# Patient Record
Sex: Male | Born: 2000 | Race: White | Hispanic: No | Marital: Single | Smoking: Never smoker
Health system: Southern US, Community
[De-identification: ages and names within clinical notes are randomized; demographics above are authoritative.]

## PROBLEM LIST (undated history)

## (undated) ENCOUNTER — Emergency Department (HOSPITAL_COMMUNITY): Discharge status: Absent without leave | Payer: BC Managed Care – PPO

## (undated) DIAGNOSIS — F909 Attention-deficit hyperactivity disorder, unspecified type: Secondary | ICD-10-CM

## (undated) DIAGNOSIS — T7840XA Allergy, unspecified, initial encounter: Secondary | ICD-10-CM

## (undated) HISTORY — DX: Allergy, unspecified, initial encounter: T78.40XA

## (undated) HISTORY — DX: Attention-deficit hyperactivity disorder, unspecified type: F90.9

---
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
## 2018-02-09 ENCOUNTER — Encounter: Payer: Self-pay | Admitting: Physician Assistant

## 2018-02-18 ENCOUNTER — Other Ambulatory Visit: Payer: Self-pay

## 2018-02-18 ENCOUNTER — Ambulatory Visit: Payer: BC Managed Care – PPO | Admitting: Physician Assistant

## 2018-02-18 ENCOUNTER — Encounter: Payer: Self-pay | Admitting: Physician Assistant

## 2018-02-18 VITALS — BP 110/60 | HR 70 | Temp 97.8°F | Resp 18 | Ht 67.64 in | Wt 136.2 lb

## 2018-02-18 DIAGNOSIS — F901 Attention-deficit hyperactivity disorder, predominantly hyperactive type: Secondary | ICD-10-CM

## 2018-02-18 DIAGNOSIS — F419 Anxiety disorder, unspecified: Secondary | ICD-10-CM | POA: Diagnosis not present

## 2018-02-18 MED ORDER — AMPHETAMINE-DEXTROAMPHETAMINE 10 MG PO TABS: 10.0000 mg | ORAL_TABLET | Freq: Two times a day (BID) | ORAL | 0 refills | Status: AC

## 2018-02-18 NOTE — Patient Instructions (Addendum)
MORE stretching. Especially the hamstrings and achilles tendons. INCREASE core strengthening (abdominals and glutes). Running techniques: schedule an appointment with the athletic trainer at your school for an evaluation of your current technique and recommended changes to reduce risk of injury and enhance speed, endurance    IF you received an x-ray today, you will receive an invoice from [facility]. Please contact [facility] at [phone] with questions or concerns regarding your invoice.   IF you received labwork today, you will receive an invoice from [facility]. Please contact [facility] at [phone] with questions or concerns regarding your invoice.   Our billing staff will not be able to assist you with questions regarding bills from these companies.  You will be contacted with the lab results as soon as they are available. The fastest way to get your results is to activate your My Chart account. Instructions are located on the last page of this paperwork. If you have not heard from [place] regarding the results in 2 weeks, please contact this office.

## 2018-02-18 NOTE — Assessment & Plan Note (Signed)
Well controlled, but possibly increased irritability with stimulant treatment. Refer for additional evaluation with Howard Boone to identify gaps in his skills for future learning and independence.

## 2018-02-18 NOTE — Progress Notes (Signed)
Patient ID: Howard Boone, male    DOB: [birth year], 17 y.o.   MRN: [id number]  PCP: Harrison Mons, PA-C  Chief Complaint  Patient presents with  . 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  He is accompanied by his mother initially, who then leaves to allow him to talk with me privately.  Heading to [place] with a school program. 9 days of adventure. Maternal aunt and grandmother have paid for the trip. Feels very fortunate.  "I think I hurt my wrist a few days ago." Developed RIGHT wrist pain following a lacrosse game. No recalled injury during play. Improvement with sleeping in a wrist splint x 2 nights. Feels a popping with certain movements. No weakness, tingling, numbness. No swelling.  RIGHT medial knee pain x several years, following an injury. Does not limit him. Shin splints. He'd like to know how to improve his running form to reduce this.  Mom notes irritability with Adderall, his sister has similar adverse effects. It is notable that his parents and sister all take Adderall.  Mom is interested in him developing some coping strategies, organizational strategies, development of ability to take on more of his own self care.  She wonders if he has a mild form of autism, though hasn't wanted him tested for fear of labeling. Ready now, wants him to have the tools to be successful. He's very bright, doesn't apply himself when he isn't interested. For example, struggles in math, reporting that it's boring, while doing well in other classes. Rants. Won't stop talking, interrupting. Sometimes socially inappropriate. Is taking 2 classes at [employer] this semester to complete graduation requirements, bored at school. Accepted to [employer] for the fall, but financially, unlikely to be able to go. Mom is concerned that he doesn't have the skills yet to live away from parents, that he has unrealistic  expectations (like that his mother will move to [place] so that he can live with her while he goes to school).  Review of Systems As above.    Patient Active Problem List   Diagnosis Date Noted  . Abnormal celiac antibody panel 11/26/2017  . 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 Acne 08/12/2016  . Allergic rhinitis 09/13/2012  . ADHD (attention deficit hyperactivity disorder) 12/10/2011  . Anxiety 12/10/2011     Prior to Admission medications   Medication Sig Start Date End Date Taking? Authorizing Provider  amphetamine-dextroamphetamine (ADDERALL) 10 MG tablet Take 1 tablet (10 mg total) by mouth 2 (two) times daily with a meal. 11/26/17  Yes Andilyn Bettcher, PA-C  amphetamine-dextroamphetamine (ADDERALL) 10 MG tablet Take 1 tablet (10 mg total) by mouth 2 (two) times daily with a meal. 11/26/17  Yes Misa Fedorko, PA-C  amphetamine-dextroamphetamine (ADDERALL) 10 MG tablet Take 1 tablet (10 mg total) by mouth 2 (two) times daily with a meal. 11/26/17  Yes Naylani Bradner, PA-C  cetirizine (ZYRTEC) 5 MG tablet Take 10 mg by mouth daily. Reported on 01/04/2016   Yes [provider]  tretinoin (RETIN-A) 0.1 % cream Apply topically at bedtime. 08/12/16  Yes Skilar Marcou, PA-C     No Known Allergies     Objective:  Physical Exam  Constitutional: He is oriented to person, place, and time. He appears well-developed and well-nourished. He is active and cooperative. No distress.  BP 110/60   Pulse 70  Temp 97.8 F (36.6 C) (Oral)   Resp 18   Ht 5' 7.64" (1.718 m)   Wt 136 lb 3.2 oz (61.8 kg)   SpO2 97%   BMI 20.93 kg/m   HENT:  Head: Normocephalic and atraumatic.  Right Ear: Hearing normal.  Left Ear: Hearing normal.  Eyes: Conjunctivae are normal. No scleral icterus.  Neck: Normal range of motion. Neck supple. No thyromegaly present.  Cardiovascular: Normal rate, regular rhythm and normal heart  sounds.  Pulses:      Radial pulses are 2+ on the right side, and 2+ on the left side.  Pulmonary/Chest: Effort normal and breath sounds normal.  Musculoskeletal:       Right wrist: Normal.       Left wrist: Normal.       Cervical back: Normal.       Thoracic back: Normal.       Lumbar back: Normal.       Right forearm: Normal.       Right hand: Normal. Normal sensation noted. Normal strength noted.  Lymphadenopathy:       Head (right side): No tonsillar, no preauricular, no posterior auricular and no occipital adenopathy present.       Head (left side): No tonsillar, no preauricular, no posterior auricular and no occipital adenopathy present.    He has no cervical adenopathy.       Right: No supraclavicular adenopathy present.       Left: No supraclavicular adenopathy present.  Neurological: He is alert and oriented to person, place, and time. No sensory deficit.  Skin: Skin is warm, dry and intact. No rash noted. No cyanosis or erythema. Nails show no clubbing.  Psychiatric: He has a normal mood and affect. His speech is normal and behavior is normal.   Wt Readings from Last 3 Encounters:  02/18/18 136 lb 3.2 oz (61.8 kg) (26 %, Z= -0.63)*  11/26/17 137 lb 9.6 oz (62.4 kg) (30 %, Z= -0.52)*  07/13/17 141 lb (64 kg) (39 %, Z= -0.27)*   * Growth percentiles are based on CDC (Boys, 2-20 Years) data.       Assessment & Plan:   Problem List Items Addressed This Visit    ADHD (attention deficit hyperactivity disorder) - Primary    Well controlled, but possibly increased irritability with stimulant treatment. Refer for additional evaluation with Delmer Islam to identify gaps in his skills for future learning and independence.      Relevant Medications   amphetamine-dextroamphetamine (ADDERALL) 10 MG tablet (Start on 04/19/2018)   amphetamine-dextroamphetamine (ADDERALL) 10 MG tablet (Start on 03/20/2018)   amphetamine-dextroamphetamine (ADDERALL) 10 MG tablet   Other Relevant Orders     Ambulatory referral to Psychology   Anxiety    Appears well controlled.      Relevant Orders   Ambulatory referral to Psychology       Return in about 3 months (around 05/20/2018) for re-evaluation of attention, mood.   Fara Chute, PA-C [facility]

## 2018-02-18 NOTE — Assessment & Plan Note (Signed)
Appears well controlled ?

## 2018-03-04 ENCOUNTER — Ambulatory Visit: Payer: BC Managed Care – PPO | Admitting: Family Medicine

## 2018-03-04 ENCOUNTER — Encounter: Payer: Self-pay | Admitting: Family Medicine

## 2018-03-04 ENCOUNTER — Other Ambulatory Visit: Payer: Self-pay

## 2018-03-04 VITALS — BP 114/64 | HR 66 | Temp 98.0°F | Resp 16 | Ht 67.72 in | Wt 135.0 lb

## 2018-03-04 DIAGNOSIS — R05 Cough: Secondary | ICD-10-CM | POA: Diagnosis not present

## 2018-03-04 DIAGNOSIS — R1031 Right lower quadrant pain: Secondary | ICD-10-CM

## 2018-03-04 DIAGNOSIS — R1032 Left lower quadrant pain: Secondary | ICD-10-CM

## 2018-03-04 DIAGNOSIS — R059 Cough, unspecified: Secondary | ICD-10-CM

## 2018-03-04 LAB — POCT URINALYSIS DIP (MANUAL ENTRY)
BILIRUBIN UA: NEGATIVE
Glucose, UA: NEGATIVE mg/dL
Ketones, POC UA: NEGATIVE mg/dL
Leukocytes, UA: NEGATIVE
NITRITE UA: NEGATIVE
PH UA: 7.5 (ref 5.0–8.0)
Protein Ur, POC: NEGATIVE mg/dL
SPEC GRAV UA: 1.02 (ref 1.010–1.025)
Urobilinogen, UA: 0.2 E.U./dL

## 2018-03-04 NOTE — Progress Notes (Signed)
Chief Complaint  Patient presents with  . Cough    x 3 days ago, lower abdominal pain, will pass gas then it goes away x 2 days  runny nose     HPI  suprapubic tendenressness Pt returned from [place] this morning at 1am He was there for 8 days He reports that he has a warm cramping pain  He denies any diarrhea He is urinating well He has eaten today  When he passes gas it goes away  Cough He states that he has been coughing for 3 days It is nonproductive No fevers or chills No sick contacts No wheezing, no sob, no fatigue   Past Medical History:  Diagnosis Date  . ADHD (attention deficit hyperactivity disorder)   . Allergy     Current Outpatient Medications  Medication Sig Dispense Refill  . [START ON 04/19/2018] amphetamine-dextroamphetamine (ADDERALL) 10 MG tablet Take 1 tablet (10 mg total) by mouth 2 (two) times daily with a meal. 60 tablet 0  . cetirizine (ZYRTEC) 5 MG tablet Take 10 mg by mouth daily. Reported on 01/04/2016    . tretinoin (RETIN-A) 0.1 % cream Apply topically at bedtime. 45 g 2  . [START ON 03/20/2018] amphetamine-dextroamphetamine (ADDERALL) 10 MG tablet Take 1 tablet (10 mg total) by mouth 2 (two) times daily with a meal. (Patient not taking: Reported on 03/04/2018) 60 tablet 0  . amphetamine-dextroamphetamine (ADDERALL) 10 MG tablet Take 1 tablet (10 mg total) by mouth 2 (two) times daily with a meal. (Patient not taking: Reported on 03/04/2018) 60 tablet 0   No current facility-administered medications for this visit.     Allergies: No Known Allergies  History reviewed. No pertinent surgical history.  Social History   Socioeconomic History  . Marital status: Single    Spouse name: Not on file  . Number of children: Not on file  . Years of education: Not on file  . Highest education level: Not on file  Occupational History  . Occupation: Ship broker  Social Needs  . Financial resource strain: Not on file  . Food insecurity:    Worry:  Not on file    Inability: Not on file  . Transportation needs:    Medical: Not on file    Non-medical: Not on file  Tobacco Use  . Smoking status: Never Smoker  . Smokeless tobacco: Never Used  Substance and Sexual Activity  . Alcohol use: No    Alcohol/week: 0.0 oz  . Drug use: No  . Sexual activity: Never  Lifestyle  . Physical activity:    Days per week: Not on file    Minutes per session: Not on file  . Stress: Not on file  Relationships  . Social connections:    Talks on phone: Not on file    Gets together: Not on file    Attends religious service: Not on file    Active member of club or organization: Not on file    Attends meetings of clubs or organizations: Not on file    Relationship status: Not on file  Other Topics Concern  . Not on file  Social History Narrative   Lives with both parents and younger sister in the same household.   Both parents teach at [employer].    Family History  Problem Relation Age of Onset  . Hypertension Mother   . ADD / ADHD Mother   . Anxiety disorder Mother   . Hyperlipidemia Father   . Cancer Father  rectal cancer  . ADD / ADHD Father   . Hypertension Maternal Grandfather   . Diabetes Maternal Grandfather   . Hyperlipidemia Paternal Grandmother   . Heart disease Paternal Grandmother   . Cancer Paternal Grandmother   . Heart disease Paternal Grandfather   . Hyperlipidemia Paternal Grandfather   . ADD / ADHD Sister   . Celiac disease Sister      ROS Review of Systems See HPI Constitution: No fevers or chills No malaise No diaphoresis Skin: No rash or itching Eyes: no blurry vision, no double vision GU: no dysuria or hematuria Neuro: no dizziness or headaches all others reviewed and negative   Objective: Vitals:   03/04/18 1158  BP: 114/64  Pulse: 66  Resp: 16  Temp: 98 F (36.7 C)  SpO2: 98%  Weight: 135 lb (61.2 kg)  Height: 5' 7.72" (1.72 m)    Physical Exam Physical Exam  Constitutional: She  is oriented to person, place, and time. She appears well-developed and well-nourished.  HENT:  Head: Normocephalic and atraumatic.  Eyes: Conjunctivae and EOM are normal.  Ears: TM normal bilaterally, external canal normal bilaterally Cardiovascular: Normal rate, regular rhythm and normal heart sounds.   Pulmonary/Chest: Effort normal and breath sounds normal. No respiratory distress. She has no wheezes.  Abdominal: Normal appearance and bowel sounds are normal. There is no tenderness. There is no CVA tenderness.  Neurological: She is alert and oriented to person, place, and time.     Component     Latest Ref Rng & Units 03/04/2018 03/04/2018        12:27 PM 12:27 PM  Color, UA     yellow yellow   Clarity, UA     clear clear   Glucose     negative mg/dL negative   Bilirubin, UA     negative negative negative  Specific Gravity, UA     1.010 - 1.025 1.020   RBC, UA     negative trace-intact (A)   pH, UA     5.0 - 8.0 7.5   Protein, UA     negative mg/dL negative   Urobilinogen, UA     0.2 or 1.0 E.U./dL 0.2   Nitrite, UA     Negative Negative   Leukocytes, UA     Negative Negative     Assessment and Plan Anmol was seen today for cough.  Diagnoses and all orders for this visit:  Acute bilateral lower abdominal pain- advised continued hydration -     POCT urinalysis dipstick  Cough no URI Advised antihistamines for cough     A

## 2018-03-04 NOTE — Patient Instructions (Signed)
     IF you received an x-ray today, you will receive an invoice from [facility]. Please contact [facility] at [phone] with questions or concerns regarding your invoice.   IF you received labwork today, you will receive an invoice from [facility]. Please contact [facility] at [phone] with questions or concerns regarding your invoice.   Our billing staff will not be able to assist you with questions regarding bills from these companies.  You will be contacted with the lab results as soon as they are available. The fastest way to get your results is to activate your My Chart account. Instructions are located on the last page of this paperwork. If you have not heard from us regarding the results in 2 weeks, please contact this office.     

## 2018-03-27 ENCOUNTER — Ambulatory Visit: Payer: BC Managed Care – PPO | Admitting: Urgent Care

## 2018-03-27 ENCOUNTER — Encounter: Payer: Self-pay | Admitting: Urgent Care

## 2018-03-27 ENCOUNTER — Other Ambulatory Visit: Payer: Self-pay

## 2018-03-27 VITALS — BP 118/64 | HR 64 | Temp 98.4°F | Resp 16 | Ht 67.73 in | Wt 134.6 lb

## 2018-03-27 DIAGNOSIS — B9789 Other viral agents as the cause of diseases classified elsewhere: Secondary | ICD-10-CM

## 2018-03-27 DIAGNOSIS — R0989 Other specified symptoms and signs involving the circulatory and respiratory systems: Secondary | ICD-10-CM

## 2018-03-27 DIAGNOSIS — J069 Acute upper respiratory infection, unspecified: Secondary | ICD-10-CM

## 2018-03-27 MED ORDER — CETIRIZINE HCL 10 MG PO TABS: 10.0000 mg | ORAL_TABLET | Freq: Every day | ORAL | 1 refills | Status: AC

## 2018-03-27 MED ORDER — BENZONATATE 100 MG PO CAPS
100.0000 mg | ORAL_CAPSULE | Freq: Three times a day (TID) | ORAL | 0 refills | Status: AC | PRN
Start: 2018-03-27 — End: ?

## 2018-03-27 MED ORDER — PSEUDOEPHEDRINE HCL ER 120 MG PO TB12: 120.0000 mg | ORAL_TABLET | Freq: Two times a day (BID) | ORAL | 3 refills | Status: AC

## 2018-03-27 NOTE — Progress Notes (Signed)
    MRN: [id number] DOB: 02-Mar-2001  Subjective:   Howard Boone is a 17 y.o. male presenting for 3 day history of runny nose, productive cough. Has tried Mucinex, DayQuil, otc cough medications. Has a history of allergies but does not take anything consistently. Denies history of asthma. Denies smoking cigarettes. Denies sinus pain, ear pain, sore throat, chest pain, shob, abdominal pain, rashes.   Howard Boone has a current medication list which includes the following prescription(s): amphetamine-dextroamphetamine, amphetamine-dextroamphetamine, amphetamine-dextroamphetamine, cetirizine, and tretinoin. Also has No Known Allergies.  Howard Boone  has a past medical history of ADHD (attention deficit hyperactivity disorder) and Allergy. Denies past surgical history.  Objective:   Vitals: BP 118/64   Pulse 64   Temp 98.4 F (36.9 C) (Oral)   Resp 16   Ht 5' 7.73" (1.72 m)   Wt 134 lb 9.6 oz (61.1 kg)   SpO2 96%   BMI 20.63 kg/m   Physical Exam  Constitutional: He is oriented to person, place, and time. He appears well-developed and well-nourished.  HENT:  Throat with thick globs of postnasal drainage and cobblestone pattern.  There is no mucosal edema or sinus tenderness.  TMs opaque bilaterally but without erythema.  Eyes: Right eye exhibits no discharge. Left eye exhibits no discharge. No scleral icterus.  Neck: Normal range of motion. Neck supple.  Cardiovascular: Normal rate, regular rhythm and intact distal pulses. Exam reveals no gallop and no friction rub.  No murmur heard. Pulmonary/Chest: No respiratory distress. He has no wheezes. He has no rales.  Lymphadenopathy:    He has no cervical adenopathy.  Neurological: He is alert and oriented to person, place, and time.  Skin: Skin is warm and dry.  Psychiatric: He has a normal mood and affect.   Assessment and Plan :   Viral URI with cough  Runny nose  We will manage supportively for viral URI.  Recommended patient restart his  allergy medicines.  Follow-up as needed.  Jaynee Eagles, PA-C [facility] [phone] 03/27/2018  4:16 PM

## 2018-03-27 NOTE — Patient Instructions (Addendum)
Viral Respiratory Infection A respiratory infection is an illness that affects part of the respiratory system, such as the lungs, nose, or throat. Most respiratory infections are caused by either viruses or bacteria. A respiratory infection that is caused by a virus is called a viral respiratory infection. Common types of viral respiratory infections include:  A cold.  The flu (influenza).  A respiratory syncytial virus (RSV) infection.  How do I know if I have a viral respiratory infection? Most viral respiratory infections cause:  A stuffy or runny nose.  Yellow or green nasal discharge.  A cough.  Sneezing.  Fatigue.  Achy muscles.  A sore throat.  Sweating or chills.  A fever.  A headache.  How are viral respiratory infections treated? If influenza is diagnosed early, it may be treated with an antiviral medicine that shortens the length of time a person has symptoms. Symptoms of viral respiratory infections may be treated with over-the-counter and prescription medicines, such as:  Expectorants. These make it easier to cough up mucus.  Decongestant nasal sprays.  Health care providers do not prescribe antibiotic medicines for viral infections. This is because antibiotics are designed to kill bacteria. They have no effect on viruses. How do I know if I should stay home from work or school? To avoid exposing others to your respiratory infection, stay home if you have:  A fever.  A persistent cough.  A sore throat.  A runny nose.  Sneezing.  Muscles aches.  Headaches.  Fatigue.  Weakness.  Chills.  Sweating.  Nausea.  Follow these instructions at home:  Rest as much as possible.  Take over-the-counter and prescription medicines only as told by your health care provider.  Drink enough fluid to keep your urine clear or pale yellow. This helps prevent dehydration and helps loosen up mucus.  Gargle with a salt-water mixture 3-4 times per day or  as needed. To make a salt-water mixture, completely dissolve -1 tsp of salt in 1 cup of warm water.  Use nose drops made from salt water to ease congestion and soften raw skin around your nose.  Do not drink alcohol.  Do not use tobacco products, including cigarettes, chewing tobacco, and e-cigarettes. If you need help quitting, ask your health care provider. Contact a health care provider if:  Your symptoms last for 10 days or longer.  Your symptoms get worse over time.  You have a fever.  You have severe sinus pain in your face or forehead.  The glands in your jaw or neck become very swollen. Get help right away if:  You feel pain or pressure in your chest.  You have shortness of breath.  You faint or feel like you will faint.  You have severe and persistent vomiting.  You feel confused or disoriented. This information is not intended to replace advice given to you by your health care provider. Make sure you discuss any questions you have with your health care provider. Document Released: 08/04/2005 Document Revised: 04/01/2016 Document Reviewed: 04/02/2015 Elsevier Interactive Patient Education  2018 [employer].     IF you received an x-ray today, you will receive an invoice from [facility]. Please contact [facility] at [phone] with questions or concerns regarding your invoice.   IF you received labwork today, you will receive an invoice from [place]. Please contact [facility] at [phone] with questions or concerns regarding your invoice.   Our billing staff will not be able to assist you with questions regarding bills from  these companies.  You will be contacted with the lab results as soon as they are available. The fastest way to get your results is to activate your My Chart account. Instructions are located on the last page of this paperwork. If you have not heard from [place] regarding the results in 2 weeks, please contact this office.

## 2018-04-21 ENCOUNTER — Telehealth: Payer: Self-pay | Admitting: Physician Assistant

## 2018-04-21 NOTE — Telephone Encounter (Signed)
Howard Boone from [facility] called to schedule an appt.  Pt states that it was not a good time and that he will call back to schedule an appt.  He has yet to call back [place] to schedule

## 2018-04-27 ENCOUNTER — Encounter: Payer: Self-pay | Admitting: Physician Assistant

## 2018-04-27 ENCOUNTER — Telehealth: Payer: Self-pay | Admitting: Physician Assistant

## 2018-04-27 NOTE — Telephone Encounter (Signed)
Copied from [place] [id number]. Topic: Inquiry >> Apr 27, 2018  9:10 AM Cecelia Byars, NT wrote: Reason for CRM: Patients mom called and said he needs another copy of the letter she previously given him for the [facility] stating that he takes medication for acne please call  [phone] this is a time sensitive issue and he is

## 2018-04-28 NOTE — Telephone Encounter (Signed)
Message sent to [facility].  Re: message for [employer].  Reviewed chart without success for finding a letter previously sent.

## 2018-05-01 NOTE — Telephone Encounter (Signed)
I have mychart messaged this patient to get more information.  The acne medication he has on his medication list was written in 10/17.  I also saw no note in his letter section in regards to this issue.

## 2018-05-01 NOTE — Telephone Encounter (Signed)
Chelle - do you remember anything about this - it has not been prescribed since 08/2016 and at that time only for 2 refills

## 2018-05-02 NOTE — Telephone Encounter (Signed)
Spoke with mother - she got letter from dermatologist.

## 2018-07-01 ENCOUNTER — Emergency Department (HOSPITAL_COMMUNITY): Payer: BC Managed Care – PPO

## 2018-07-01 ENCOUNTER — Other Ambulatory Visit: Payer: Self-pay

## 2018-07-01 ENCOUNTER — Encounter (HOSPITAL_COMMUNITY): Payer: Self-pay | Admitting: Emergency Medicine

## 2018-07-01 ENCOUNTER — Emergency Department (HOSPITAL_COMMUNITY)
Admission: EM | Admit: 2018-07-01 | Discharge: 2018-07-02 | Disposition: A | Discharge status: Home / Self Care | Payer: BC Managed Care – PPO | Attending: Emergency Medicine | Admitting: Emergency Medicine

## 2018-07-01 DIAGNOSIS — Y92015 Private garage of single-family (private) house as the place of occurrence of the external cause: Secondary | ICD-10-CM | POA: Insufficient documentation

## 2018-07-01 DIAGNOSIS — Y999 Unspecified external cause status: Secondary | ICD-10-CM | POA: Diagnosis not present

## 2018-07-01 DIAGNOSIS — W138XXA Fall from, out of or through other building or structure, initial encounter: Secondary | ICD-10-CM | POA: Diagnosis not present

## 2018-07-01 DIAGNOSIS — Z79899 Other long term (current) drug therapy: Secondary | ICD-10-CM | POA: Diagnosis not present

## 2018-07-01 DIAGNOSIS — S51012A Laceration without foreign body of left elbow, initial encounter: Secondary | ICD-10-CM | POA: Diagnosis not present

## 2018-07-01 DIAGNOSIS — T07XXXA Unspecified multiple injuries, initial encounter: Secondary | ICD-10-CM | POA: Diagnosis present

## 2018-07-01 DIAGNOSIS — F909 Attention-deficit hyperactivity disorder, unspecified type: Secondary | ICD-10-CM | POA: Insufficient documentation

## 2018-07-01 DIAGNOSIS — S32010A Wedge compression fracture of first lumbar vertebra, initial encounter for closed fracture: Secondary | ICD-10-CM | POA: Insufficient documentation

## 2018-07-01 DIAGNOSIS — Y939 Activity, unspecified: Secondary | ICD-10-CM | POA: Insufficient documentation

## 2018-07-01 DIAGNOSIS — M545 Low back pain: Secondary | ICD-10-CM | POA: Insufficient documentation

## 2018-07-01 DIAGNOSIS — W19XXXA Unspecified fall, initial encounter: Secondary | ICD-10-CM

## 2018-07-01 MED ORDER — LIDOCAINE-EPINEPHRINE (PF) 2 %-1:200000 IJ SOLN
20.0000 mL | Freq: Once | INTRAMUSCULAR | Status: AC
  Administered 2018-07-02: 20 mL
  Filled 2018-07-01: qty 20

## 2018-07-01 MED ORDER — ACETAMINOPHEN 500 MG PO TABS
1000.0000 mg | ORAL_TABLET | Freq: Once | ORAL | Status: AC
  Administered 2018-07-02: 1000 mg via ORAL
  Filled 2018-07-01: qty 2

## 2018-07-01 MED ORDER — HYDROMORPHONE HCL 1 MG/ML IJ SOLN
1.0000 mg | Freq: Once | INTRAMUSCULAR | Status: AC
  Administered 2018-07-01: 1 mg via INTRAVENOUS
  Filled 2018-07-01: qty 1

## 2018-07-01 MED ORDER — TETANUS-DIPHTH-ACELL PERTUSSIS 5-2.5-18.5 LF-MCG/0.5 IM SUSP: 0.5000 mL | Freq: Once | INTRAMUSCULAR | Status: DC

## 2018-07-01 NOTE — ED Notes (Signed)
Pt not in c-collar on arrival to ED Pt placed in c-collar after removal from EMS stretcher to ED bed

## 2018-07-01 NOTE — ED Notes (Signed)
Bed: RESB Expected date:  Expected time:  Means of arrival:  Comments: EMS Fall

## 2018-07-01 NOTE — ED Notes (Signed)
Irrigated wound with 500 cc NS

## 2018-07-01 NOTE — ED Provider Notes (Signed)
Medical screening examination/treatment/procedure(s) were conducted as a shared visit with non-physician practitioner(s) and myself.  I personally evaluated the patient during the encounter.  None  Results for orders placed or performed in visit on 03/04/18  POCT urinalysis dipstick  Result Value Ref Range   Color, UA yellow yellow   Clarity, UA clear clear   Glucose, UA negative negative mg/dL   Bilirubin, UA negative negative   Ketones, POC UA negative negative mg/dL   Spec Grav, UA 1.020 1.010 - 1.025   Blood, UA trace-intact (A) negative   pH, UA 7.5 5.0 - 8.0   Protein Ur, POC negative negative mg/dL   Urobilinogen, UA 0.2 0.2 or 1.0 E.U./dL   Nitrite, UA Negative Negative   Leukocytes, UA Negative Negative   Dg Lumbar Spine Complete  Result Date: 07/01/2018 CLINICAL DATA:  Post fall. Fall from attic through she walking to garage. Lumbosacral back pain, pelvic and sacral pain. EXAM: LUMBAR SPINE - COMPLETE 4+ VIEW COMPARISON:  Radiograph 07/21/2012 FINDINGS: L1 superior endplate compression fracture with anterior loss of height of approximately 30%. No posterior element involvement by radiograph. Fracture likely extends to the posterior cortex. Remaining vertebral body heights are preserved. No evidence of additional acute fracture. T12 ribs are diminutive. IMPRESSION: L1 superior endplate compression fracture with possible extension to the posterior cortex. Recommend CT for characterization and to evaluate for posterior element involvement. Electronically Signed   By: Jeb Levering M.D.   On: 07/01/2018 22:35   Dg Sacrum/coccyx  Result Date: 07/01/2018 CLINICAL DATA:  Post fall. Fall from attic through she walking to garage. Lumbosacral back pain, pelvic and sacral pain. EXAM: SACRUM AND COCCYX - 2+ VIEW COMPARISON:  None. FINDINGS: L5 vertebra is sacralized. Sacroiliac joints are congruent. Cortical margins of the sacrum and coccyx are intact. No evidence of sacral alar  disruption. IMPRESSION: Negative for fracture of the sacrum or coccyx. Electronically Signed   By: Jeb Levering M.D.   On: 07/01/2018 22:37   Dg Ankle Complete Left  Result Date: 07/01/2018 CLINICAL DATA:  Pain after fall EXAM: LEFT ANKLE COMPLETE - 3+ VIEW COMPARISON:  None. FINDINGS: There is no evidence of fracture, dislocation, or joint effusion. There is no evidence of arthropathy or other focal bone abnormality. Soft tissues are unremarkable. IMPRESSION: Negative. Electronically Signed   By: Dorise Bullion III M.D   On: 07/01/2018 22:32   Ct Head Wo Contrast  Result Date: 07/01/2018 CLINICAL DATA:  [place] from attic EXAM: CT HEAD WITHOUT CONTRAST CT CERVICAL SPINE WITHOUT CONTRAST TECHNIQUE: Multidetector CT imaging of the head and cervical spine was performed following the standard protocol without intravenous contrast. Multiplanar CT image reconstructions of the cervical spine were also generated. COMPARISON:  None. FINDINGS: CT HEAD FINDINGS Brain: No evidence of acute infarction, hemorrhage, hydrocephalus, extra-axial collection or mass lesion/mass effect. Vascular: No hyperdense vessel or unexpected calcification. Skull: Normal. Negative for fracture or focal lesion. Sinuses/Orbits: No acute finding. Other: None CT CERVICAL SPINE FINDINGS Alignment: No subluxation.  Facet alignment within normal limits Skull base and vertebrae: No acute fracture. No primary bone lesion or focal pathologic process. Soft tissues and spinal canal: No prevertebral fluid or swelling. No visible canal hematoma. Disc levels:  Negative Upper chest: Negative Other: Negative IMPRESSION: 1. Negative non contrasted CT appearance of the brain. 2. No acute osseous abnormality of the cervical spine. Electronically Signed   By: Donavan Foil M.D.   On: 07/01/2018 22:14   Ct Cervical Spine Wo Contrast  Result Date:  07/01/2018 CLINICAL DATA:  [place] from attic EXAM: CT HEAD WITHOUT CONTRAST CT CERVICAL SPINE WITHOUT CONTRAST  TECHNIQUE: Multidetector CT imaging of the head and cervical spine was performed following the standard protocol without intravenous contrast. Multiplanar CT image reconstructions of the cervical spine were also generated. COMPARISON:  None. FINDINGS: CT HEAD FINDINGS Brain: No evidence of acute infarction, hemorrhage, hydrocephalus, extra-axial collection or mass lesion/mass effect. Vascular: No hyperdense vessel or unexpected calcification. Skull: Normal. Negative for fracture or focal lesion. Sinuses/Orbits: No acute finding. Other: None CT CERVICAL SPINE FINDINGS Alignment: No subluxation.  Facet alignment within normal limits Skull base and vertebrae: No acute fracture. No primary bone lesion or focal pathologic process. Soft tissues and spinal canal: No prevertebral fluid or swelling. No visible canal hematoma. Disc levels:  Negative Upper chest: Negative Other: Negative IMPRESSION: 1. Negative non contrasted CT appearance of the brain. 2. No acute osseous abnormality of the cervical spine. Electronically Signed   By: Donavan Foil M.D.   On: 07/01/2018 22:14   Dg Hip Unilat W Or Wo Pelvis 2-3 Views Left  Result Date: 07/01/2018 CLINICAL DATA:  Post fall. Fall from attic through she walking to garage. Lumbosacral back pain, pelvic and sacral pain. EXAM: DG HIP (WITH OR WITHOUT PELVIS) 2-3V LEFT COMPARISON:  None. FINDINGS: The cortical margins of the bony pelvis and left hip are intact. No fracture. Pubic symphysis and sacroiliac joints are congruent. Both femoral heads are well-seated in the respective acetabula. IMPRESSION: Negative radiographs of the pelvis and left hip. Electronically Signed   By: Jeb Levering M.D.   On: 07/01/2018 22:37    Patient seen by me along with physician assistant.  Patient was brought in by EMS from her friend's house.  Patient fell from the attic and through the sheet rock and into the garage.  Patient attempted to catch himself on a beam and has a laceration of about  3 inches long to the left upper arm at the medial epicondyle part of the elbow.  No active bleeding.  Patient also with complaint of low back pain.  No loss of consciousness.  Patient's CT head without any acute findings.  X-ray of lumbar back shows an L1 compression fracture CT was recommended by radiology that will be obtained.  Otherwise no other bony injuries.  Also recommend getting a x-ray of the left elbow.  Patient's left arm with good ulnar and radial pulse.  Sensations intact along the ulnar nerve no evidence of any ulnar nerve injury.  No evidence of any encroachment into the joint space after exploration of that wound.  Patient disposition will be depending upon the the L1 compression fracture.  If it does not show any significant encroachment onto the spinal cord or any instability patient can be treated with pain control outpatient follow-up.  The left elbow wound can be sutured closed.   Fredia Sorrow, MD 07/01/18 [phone]

## 2018-07-01 NOTE — ED Provider Notes (Addendum)
[place] DEPT Provider Note   CSN: [id number] Arrival date & time: 07/01/18  2031     History   Chief Complaint Chief Complaint  Patient presents with  . Fall    Through ceiling     HPI Howard Boone is a 17 y.o. male here for evaluation after fall.  Patient states he was in the addict when the flooring cracked and he fell through landing on the ground on the floor below.  Initially made contact with his left heel and then landed on his buttock.  He has a laceration to the left medial elbow.  He endorses mild neck pain, left hip and low back pain, left heel pain and local pain to the laceration of his left elbow.  There was no head trauma per patient.  There was no LOC.  Patient could not get up from the ground secondary to severe back pain.  He denies any associated headache, vision changes, nausea, vomiting, chest pain, shortness of breath, abdominal pain, bleeding, saddle anesthesia, loss of sensation or heaviness to his extremities.  Low back pain and left hip pain exacerbated by movement and palpation.  Received fentanyl on route which has improved the pain.  Has not had any voiding of urine or moved his bowels since but no loss of bladder or bowel contents.  No anticoagulants.  Patient is to begin [facility] December 2019.  HPI  Past Medical History:  Diagnosis Date  . ADHD (attention deficit hyperactivity disorder)   . Allergy     Patient Active Problem List   Diagnosis Date Noted  . Abnormal celiac antibody panel 11/26/2017  . 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 Acne 08/12/2016  . Allergic rhinitis 09/13/2012  . ADHD (attention deficit hyperactivity disorder) 12/10/2011  . Anxiety 12/10/2011    History reviewed. No pertinent surgical history.      Home Medications    Prior to Admission medications   Medication Sig Start Date End Date Taking? Authorizing Provider    amphetamine-dextroamphetamine (ADDERALL) 10 MG tablet Take 1 tablet (10 mg total) by mouth 2 (two) times daily with a meal. Patient not taking: Reported on 07/01/2018 04/19/18   Harrison Mons, PA  amphetamine-dextroamphetamine (ADDERALL) 10 MG tablet Take 1 tablet (10 mg total) by mouth 2 (two) times daily with a meal. Patient not taking: Reported on 07/01/2018 03/20/18   Harrison Mons, PA  amphetamine-dextroamphetamine (ADDERALL) 10 MG tablet Take 1 tablet (10 mg total) by mouth 2 (two) times daily with a meal. Patient not taking: Reported on 07/01/2018 02/18/18   Harrison Mons, PA  benzonatate (TESSALON) 100 MG capsule Take 1-2 capsules (100-200 mg total) by mouth 3 (three) times daily as needed. Patient not taking: Reported on 07/01/2018 03/27/18   Jaynee Eagles, PA-C  cetirizine (ZYRTEC) 10 MG tablet Take 1 tablet (10 mg total) by mouth daily. Patient not taking: Reported on 07/01/2018 03/27/18   Jaynee Eagles, PA-C  oxyCODONE (OXY IR/ROXICODONE) 5 MG immediate release tablet Take 1 tablet (5 mg total) by mouth every 4 (four) hours as needed for up to 3 days for severe pain. 07/02/18 07/05/18  Kinnie Feil, PA-C  pseudoephedrine (SUDAFED 12 HOUR) 120 MG 12 hr tablet Take 1 tablet (120 mg total) by mouth 2 (two) times daily. Patient not taking: Reported on 07/01/2018 03/27/18   Jaynee Eagles, PA-C  tretinoin (RETIN-A) 0.1 % cream Apply topically at bedtime. Patient not taking: Reported on  07/01/2018 08/12/16   Harrison Mons, PA    Family History Family History  Problem Relation Age of Onset  . Hypertension Mother   . ADD / ADHD Mother   . Anxiety disorder Mother   . Hyperlipidemia Father   . Cancer Father        rectal cancer  . ADD / ADHD Father   . Hypertension Maternal Grandfather   . Diabetes Maternal Grandfather   . Hyperlipidemia Paternal Grandmother   . Heart disease Paternal Grandmother   . Cancer Paternal Grandmother   . Heart disease Paternal Grandfather   . Hyperlipidemia  Paternal Grandfather   . ADD / ADHD Sister   . Celiac disease Sister     Social History Social History   Tobacco Use  . Smoking status: Never Smoker  . Smokeless tobacco: Never Used  Substance Use Topics  . Alcohol use: No    Alcohol/week: 0.0 standard drinks  . Drug use: No     Allergies   Patient has no known allergies.   Review of Systems Review of Systems  All other systems reviewed and are negative.    Physical Exam Updated Vital Signs BP 105/63   Pulse (!) 55   Temp 98.1 F (36.7 C) (Oral)   Resp 16   Ht 5\' 8"  (1.727 m)   Wt 62.6 kg   SpO2 96%   BMI 20.98 kg/m   Physical Exam  Constitutional: He is oriented to person, place, and time. He appears well-developed and well-nourished. He is cooperative. He is easily aroused.  In cervical collar.  Family at bedside.  HENT:  Head: Atraumatic.  No abrasions, lacerations, deformity, defect, tenderness or crepitus of facial, nasal, scalp bones. No Raccoon's eyes. No Battle's sign. No hemotympanum or otorrhea, bilaterally. No epistaxis or rhinorrhea, septum midline.  No intraoral bleeding or injury. No malocclusion.   Eyes: Conjunctivae are normal.  Lids normal. EOMs and PERRL intact.   Neck: Spinous process tenderness present.  C-spine: Mild low midline tenderness.  No paraspinal muscular tenderness. ROM of C-spine not tested, patient in cervical collar.  Trachea is midline.    Cardiovascular: Normal rate, regular rhythm, S1 normal, S2 normal and normal heart sounds. Exam reveals no distant heart sounds.  Pulses:      Carotid pulses are 2+ on the right side, and 2+ on the left side.      Radial pulses are 2+ on the right side, and 2+ on the left side.       Dorsalis pedis pulses are 2+ on the right side, and 2+ on the left side.  2+ radial and DP pulses bilaterally  Pulmonary/Chest: Effort normal and breath sounds normal. He has no decreased breath sounds.  No anterior/posterior thorax tenderness. Equal and  symmetric chest wall expansion   Abdominal: Soft.  Abdomen is NTND. No guarding. No seatbelt sign.   Genitourinary:  Genitourinary Comments: No urethral injury or bleeding. Good rectal tone.   Musculoskeletal: Normal range of motion. He exhibits tenderness. He exhibits no deformity.  T-spine: no paraspinal muscular tenderness or midline tenderness.   L-spine: Tenderness to midline upper L-spine.  No overlaying ecchymosis. No tenderness to sacrum.  Pelvis: no instability with AP/L compression, leg shortening or rotation. Decreased left hip ROM secondary to pain. Left hip F/E exacerbates low back pain.  Left elbow: 4 cm straight gaping laceration to left medial epicondyle with adipose tissue exposed, no obvious bone visualized. No focal bony tenderness to epicondyles, olecranon, full PROM of  left elbow without pain.  Left ankle: pain with heel compression. No focal bony tenderness to malleoli, achiles, full F/E of ankle without pain.   Neurological: He is alert, oriented to person, place, and time and easily aroused.  Speech is fluent without obvious dysarthria or dysphasia. Strength 5/5 with hand grip and ankle F/E.   Sensation to light touch intact in hands and feet. CN I, II and VIII not tested. CN II-XII grossly intact bilaterally.   Skin: Skin is warm and dry. Capillary refill takes less than 2 seconds.  Psychiatric: His behavior is normal. Thought content normal.     ED Treatments / Results  Labs (all labs ordered are listed, but only abnormal results are displayed) Labs Reviewed - No data to display  EKG None  Radiology Dg Lumbar Spine Complete  Result Date: 07/01/2018 CLINICAL DATA:  Post fall. Fall from attic through she walking to garage. Lumbosacral back pain, pelvic and sacral pain. EXAM: LUMBAR SPINE - COMPLETE 4+ VIEW COMPARISON:  Radiograph 07/21/2012 FINDINGS: L1 superior endplate compression fracture with anterior loss of height of approximately 30%. No posterior  element involvement by radiograph. Fracture likely extends to the posterior cortex. Remaining vertebral body heights are preserved. No evidence of additional acute fracture. T12 ribs are diminutive. IMPRESSION: L1 superior endplate compression fracture with possible extension to the posterior cortex. Recommend CT for characterization and to evaluate for posterior element involvement. Electronically Signed   By: Jeb Levering M.D.   On: 07/01/2018 22:35   Dg Sacrum/coccyx  Result Date: 07/01/2018 CLINICAL DATA:  Post fall. Fall from attic through she walking to garage. Lumbosacral back pain, pelvic and sacral pain. EXAM: SACRUM AND COCCYX - 2+ VIEW COMPARISON:  None. FINDINGS: L5 vertebra is sacralized. Sacroiliac joints are congruent. Cortical margins of the sacrum and coccyx are intact. No evidence of sacral alar disruption. IMPRESSION: Negative for fracture of the sacrum or coccyx. Electronically Signed   By: Jeb Levering M.D.   On: 07/01/2018 22:37   Dg Elbow Complete Left  Result Date: 07/01/2018 CLINICAL DATA:  17 y/o  M; fall with laceration. EXAM: LEFT ELBOW - COMPLETE 3+ VIEW COMPARISON:  None. FINDINGS: There is no evidence of fracture, dislocation, or joint effusion. There is no evidence of arthropathy or other focal bone abnormality. Soft tissue defect over the medial epicondyle. IMPRESSION: No acute fracture or dislocation identified. Soft tissue defect over the medial epicondyle. Electronically Signed   By: Kristine Garbe M.D.   On: 07/01/2018 23:39   Dg Ankle Complete Left  Result Date: 07/01/2018 CLINICAL DATA:  Pain after fall EXAM: LEFT ANKLE COMPLETE - 3+ VIEW COMPARISON:  None. FINDINGS: There is no evidence of fracture, dislocation, or joint effusion. There is no evidence of arthropathy or other focal bone abnormality. Soft tissues are unremarkable. IMPRESSION: Negative. Electronically Signed   By: Dorise Bullion III M.D   On: 07/01/2018 22:32   Ct Head Wo  Contrast  Result Date: 07/01/2018 CLINICAL DATA:  [place] from attic EXAM: CT HEAD WITHOUT CONTRAST CT CERVICAL SPINE WITHOUT CONTRAST TECHNIQUE: Multidetector CT imaging of the head and cervical spine was performed following the standard protocol without intravenous contrast. Multiplanar CT image reconstructions of the cervical spine were also generated. COMPARISON:  None. FINDINGS: CT HEAD FINDINGS Brain: No evidence of acute infarction, hemorrhage, hydrocephalus, extra-axial collection or mass lesion/mass effect. Vascular: No hyperdense vessel or unexpected calcification. Skull: Normal. Negative for fracture or focal lesion. Sinuses/Orbits: No acute finding. Other: None CT CERVICAL SPINE  FINDINGS Alignment: No subluxation.  Facet alignment within normal limits Skull base and vertebrae: No acute fracture. No primary bone lesion or focal pathologic process. Soft tissues and spinal canal: No prevertebral fluid or swelling. No visible canal hematoma. Disc levels:  Negative Upper chest: Negative Other: Negative IMPRESSION: 1. Negative non contrasted CT appearance of the brain. 2. No acute osseous abnormality of the cervical spine. Electronically Signed   By: Donavan Foil M.D.   On: 07/01/2018 22:14   Ct Cervical Spine Wo Contrast  Result Date: 07/01/2018 CLINICAL DATA:  [place] from attic EXAM: CT HEAD WITHOUT CONTRAST CT CERVICAL SPINE WITHOUT CONTRAST TECHNIQUE: Multidetector CT imaging of the head and cervical spine was performed following the standard protocol without intravenous contrast. Multiplanar CT image reconstructions of the cervical spine were also generated. COMPARISON:  None. FINDINGS: CT HEAD FINDINGS Brain: No evidence of acute infarction, hemorrhage, hydrocephalus, extra-axial collection or mass lesion/mass effect. Vascular: No hyperdense vessel or unexpected calcification. Skull: Normal. Negative for fracture or focal lesion. Sinuses/Orbits: No acute finding. Other: None CT CERVICAL SPINE FINDINGS  Alignment: No subluxation.  Facet alignment within normal limits Skull base and vertebrae: No acute fracture. No primary bone lesion or focal pathologic process. Soft tissues and spinal canal: No prevertebral fluid or swelling. No visible canal hematoma. Disc levels:  Negative Upper chest: Negative Other: Negative IMPRESSION: 1. Negative non contrasted CT appearance of the brain. 2. No acute osseous abnormality of the cervical spine. Electronically Signed   By: Donavan Foil M.D.   On: 07/01/2018 22:14   Ct Lumbar Spine Wo Contrast  Result Date: 07/01/2018 CLINICAL DATA:  17 y/o  M; L1 compression deformity. EXAM: CT LUMBAR SPINE WITHOUT CONTRAST TECHNIQUE: Multidetector CT imaging of the lumbar spine was performed without intravenous contrast administration. Multiplanar CT image reconstructions were also generated. COMPARISON:  07/01/2018 lumbar spine radiograph FINDINGS: Segmentation: L5 vertebral body partial sacralization with articulation of the transverse processes to the sacral ala. Alignment: Normal. Vertebrae: Acute fracture of the L1 superior endplate involving anterior and middle columns with 3 mm bony retropulsion and 30% anterior loss of vertebral body height. No significant associated foraminal or canal stenosis. No additional fracture identified. Paraspinal and other soft tissues: Mild paravertebral edema the L1 fracture level. No appreciable epidural hematoma. Disc levels: No significant loss of intervertebral disc space height. IMPRESSION: Acute fracture of the L1 superior endplate involving anterior and middle columns with 3 mm bony retropulsion and 30% anterior loss of vertebral body height. No significant associated foraminal or canal stenosis. No additional fracture identified. Electronically Signed   By: Kristine Garbe M.D.   On: 07/01/2018 23:48   Dg Hip Unilat W Or Wo Pelvis 2-3 Views Left  Result Date: 07/01/2018 CLINICAL DATA:  Post fall. Fall from attic through she  walking to garage. Lumbosacral back pain, pelvic and sacral pain. EXAM: DG HIP (WITH OR WITHOUT PELVIS) 2-3V LEFT COMPARISON:  None. FINDINGS: The cortical margins of the bony pelvis and left hip are intact. No fracture. Pubic symphysis and sacroiliac joints are congruent. Both femoral heads are well-seated in the respective acetabula. IMPRESSION: Negative radiographs of the pelvis and left hip. Electronically Signed   By: Jeb Levering M.D.   On: 07/01/2018 22:37    Procedures .Critical Care Performed by: Kinnie Feil, PA-C Authorized by: Kinnie Feil, PA-C   Critical care provider statement:    Critical care time (minutes):  45   Critical care was necessary to treat or prevent imminent or  life-threatening deterioration of the following conditions:  Trauma (level 2 trauma)   Critical care was time spent personally by me on the following activities:  Discussions with consultants, evaluation of patient's response to treatment, examination of patient, ordering and performing treatments and interventions, ordering and review of laboratory studies, ordering and review of radiographic studies, pulse oximetry, re-evaluation of patient's condition, obtaining history from patient or surrogate and review of old charts   I assumed direction of critical care for this patient from another provider in my specialty: no    .Marland KitchenLaceration Repair Date/Time: 07/19/2018 9:32 AM Performed by: Kinnie Feil, PA-C Authorized by: Kinnie Feil, PA-C   Consent:    Consent obtained:  Verbal   Consent given by:  Patient and parent   Risks discussed:  Infection, need for additional repair, pain, poor cosmetic result and poor wound healing   Alternatives discussed:  Delayed treatment and referral Universal protocol:    Procedure explained and questions answered to patient or proxy's satisfaction: yes     Imaging studies available: yes     Required blood products, implants, devices, and special  equipment available: yes     Site/side marked: yes     Immediately prior to procedure, a time out was called: yes     Patient identity confirmed:  Verbally with patient Anesthesia (see MAR for exact dosages):    Anesthesia method:  Local infiltration   Local anesthetic:  Lidocaine 2% WITH epi Laceration details:    Location:  Shoulder/arm   Shoulder/arm location:  L elbow   Length (cm):  4 Repair type:    Repair type:  Intermediate Pre-procedure details:    Preparation:  Patient was prepped and draped in usual sterile fashion and imaging obtained to evaluate for foreign bodies Exploration:    Hemostasis achieved with:  Epinephrine and direct pressure   Wound exploration: wound explored through full range of motion and entire depth of wound probed and visualized     Wound exploration comment:  No signs of intraarticular involvement   Wound extent: no foreign bodies/material noted, no tendon damage noted and no underlying fracture noted     Contaminated: no   Treatment:    Area cleansed with:  Betadine   Amount of cleaning:  Extensive   Irrigation solution:  Sterile water   Irrigation volume:  500    Irrigation method:  Pressure wash   Visualized foreign bodies/material removed: no   Skin repair:    Repair method:  Sutures   Suture size:  5-0   Wound skin closure material used: ethilon.   Suture technique:  Horizontal mattress Approximation:    Approximation:  Close Post-procedure details:    Dressing:  Antibiotic ointment and non-adherent dressing   Patient tolerance of procedure:  Tolerated well, no immediate complications   (including critical care time)  Medications Ordered in ED Medications  Tdap (BOOSTRIX) injection 0.5 mL (0.5 mLs Intramuscular Not Given 07/02/18 0022)  lidocaine-EPINEPHrine (XYLOCAINE W/EPI) 2 %-1:200000 (PF) injection 20 mL (20 mLs Infiltration Given by Other 07/02/18 0015)  HYDROmorphone (DILAUDID) injection 1 mg (1 mg Intravenous Given 07/01/18  2357)  acetaminophen (TYLENOL) tablet 1,000 mg (1,000 mg Oral Given 07/02/18 0000)  bacitracin 500 UNIT/GM ointment (1 application  Given 8/56/31 0037)     Initial Impression / Assessment and Plan / ED Course  I have reviewed the triage vital signs and the nursing notes.  Pertinent labs & imaging results that were available during my care of the  patient were reviewed by me and considered in my medical decision making (see chart for details).  Clinical Course as of Jul 02 201  Sat Jul 01, 2018  2245 IMPRESSION: L1 superior endplate compression fracture with possible extension to the posterior cortex. Recommend CT for characterization and to evaluate for posterior element involvement.    DG Lumbar Spine Complete [CG]  Sun Jul 02, 2018  0017 IMPRESSION: Acute fracture of the L1 superior endplate involving anterior and middle columns with 3 mm bony retropulsion and 30% anterior loss of vertebral body height. No significant associated foraminal or canal stenosis. No additional fracture identified.  CT Lumbar Spine Wo Contrast [CG]  0017 IMPRESSION: No acute fracture or dislocation identified. Soft tissue defect over the medial epicondyle.    DG Elbow Complete Left [CG]    Clinical Course User Index [CG] Kinnie Feil, PA-C   42 healthy-year-old male is here after fall through attic floor landing on left heel and low back.  No head trauma, LOC, anticoagulants.  Grossly neurologically intact.  No signs of significant chest, abdominal trauma.  He endorses low back pain.  Good rectal tone.  Imaging as above remarkable for acute fracture of L1 with 30% anterior loss of height and bony retropulsion.  Pain is adequately controlled in the ER.  Lacerations repaired.  Given age, CT findings will consult neurosurgery for recommendations.  0100: Spoke to neurosurgery who recommends TLSO brace and f/u in clinic in 2 weeks.  Will dc with high dose NSAID, oxycodone for break through pain,  bowel regimen. Instructed pt to avoid heavy lifting, twisting or exertional activity.  All pt and parental questions answered. Discussed return precautions.  Shared visit with Dr Rogene Houston. Final Clinical Impressions(s) / ED Diagnoses   Final diagnoses:  Fall, initial encounter  Closed compression fracture of first lumbar vertebra, initial encounter [facility])  Elbow laceration, left, initial encounter    ED Discharge Orders         Ordered    oxyCODONE (OXY IR/ROXICODONE) 5 MG immediate release tablet  Every 4 hours PRN     07/02/18 0115           Kinnie Feil, PA-C 07/02/18 3557    Fredia Sorrow, MD 07/09/18 3220    Kinnie Feil, PA-C 07/19/18 2542    Fredia Sorrow, MD 07/22/18 [phone]

## 2018-07-01 NOTE — ED Triage Notes (Addendum)
Patient BIB EMS from a friend's house. Patient fell from attic, through sheet rock, into the garage. Patient attempted to catch himself on a beam and has a lac about 3 inches long per EMS to left upper arm. Bleeding controlled. Patient fell onto feet and is complaining of lower back pain. Patient given 100 mcg fentanyl en route.

## 2018-07-01 NOTE — ED Notes (Signed)
Patient transported to CT and X ray 

## 2018-07-01 NOTE — ED Notes (Signed)
Patient log rolled. No bruising or bleeding on presentation to back. Tender on lumbar and sacral palpation. No abdominal tenderness.

## 2018-07-02 DIAGNOSIS — S32010A Wedge compression fracture of first lumbar vertebra, initial encounter for closed fracture: Secondary | ICD-10-CM | POA: Diagnosis not present

## 2018-07-02 MED ORDER — BACITRACIN ZINC 500 UNIT/GM EX OINT
TOPICAL_OINTMENT | CUTANEOUS | Status: AC
  Administered 2018-07-02: 1
  Filled 2018-07-02: qty 0.9

## 2018-07-02 MED ORDER — OXYCODONE HCL 5 MG PO TABS: 5.0000 mg | ORAL_TABLET | ORAL | 0 refills | Status: AC | PRN

## 2018-07-02 NOTE — ED Notes (Signed)
[employer] at bedside

## 2018-07-02 NOTE — Discharge Instructions (Addendum)
X-rays show lumbar 1 compression fracture. See printed report.   We spoke to neurosurgery at [facility] and they recommend TLSO brace, pain control and follow up in clinic in 2 weeks.  Do not perform any heavy lifting, twisting or significant back activities.  Short and light amount of walking is okay.  Take 1000 mg acetaminophen plus 600 mg ibuprofen every 8 hours.  Oxycodone for break through or severe pain.  Oxycodone can cause constipation, you should include Miralax and/or stool softener to prevent constipation.   Keep your laceration clean and dry with clean water and soap, tap dry and apply thin layer of antibiotic ointment at least twice daily. Sutures need to be removed in 7-10 days.  Monitor for signs of infection including redness, warmth, swelling, pus, fevers, chills.  Oxycodone is a narcotic pain medication that has risk of overdose, death, dependence and abuse. Mild and expected side effects include nausea, stomach upset, drowsiness, constipation. Do not consume alcohol, drive or use heavy machinery while taking this medication. Do not leave unattended around children. Flush any remaining pills that you do not use and do not share.  The emergency department has a strict policy regarding prescription of narcotic medications. We prescribe a short course for acute, new pain or injuries. We are unable to refill this medication in the emergency department for chronic pain or repeatedly.  Refill need to be done by specialist or primary care provider or pain clinic.  Contact your primary care provider or specialist for chronic pain management and refill on narcotic medications.

## 2018-09-26 ENCOUNTER — Other Ambulatory Visit: Payer: Self-pay | Admitting: Physician Assistant

## 2018-09-26 DIAGNOSIS — N6342 Unspecified lump in left breast, subareolar: Secondary | ICD-10-CM

## 2018-09-28 ENCOUNTER — Ambulatory Visit
Admission: RE | Admit: 2018-09-28 | Discharge: 2018-09-28 | Disposition: A | Discharge status: Home / Self Care | Payer: BC Managed Care – PPO | Source: Ambulatory Visit | Attending: Physician Assistant | Admitting: Physician Assistant

## 2018-09-28 DIAGNOSIS — N6342 Unspecified lump in left breast, subareolar: Secondary | ICD-10-CM

## 2019-01-20 IMAGING — US ULTRASOUND LEFT BREAST LIMITED
1 series · 9 of 9 positions shown · non-contrast
Comparison: Previous exam(s).

CLINICAL DATA: 17-year-old male with a palpable lump within the
subareolar LEFT breast.

EXAM:
ULTRASOUND OF THE LEFT BREAST

[Series 1: ultrasound left breast limited · 0.06mm/px · 9 of 9 slices shown]
[im 1/9]
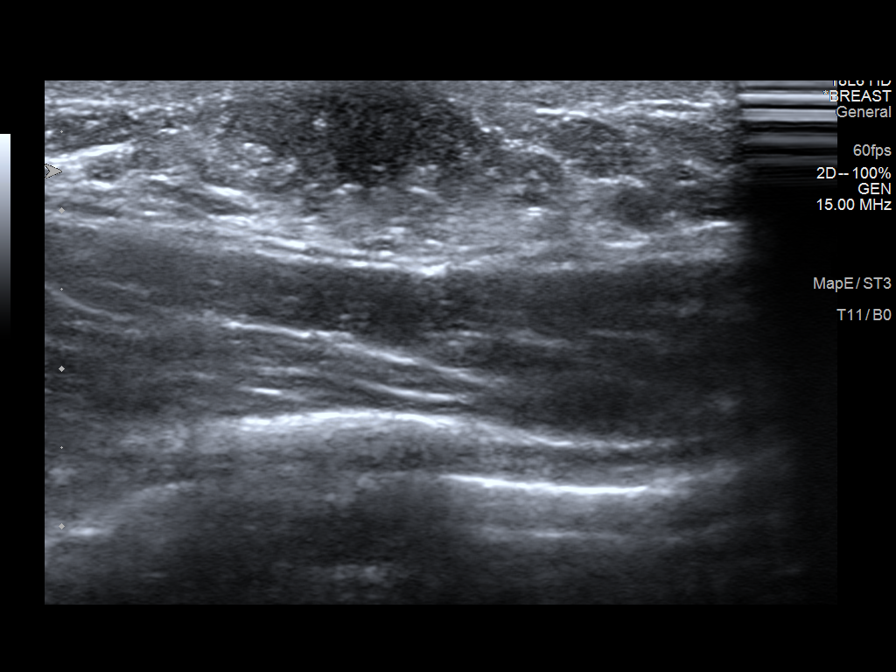
[im 2/9]
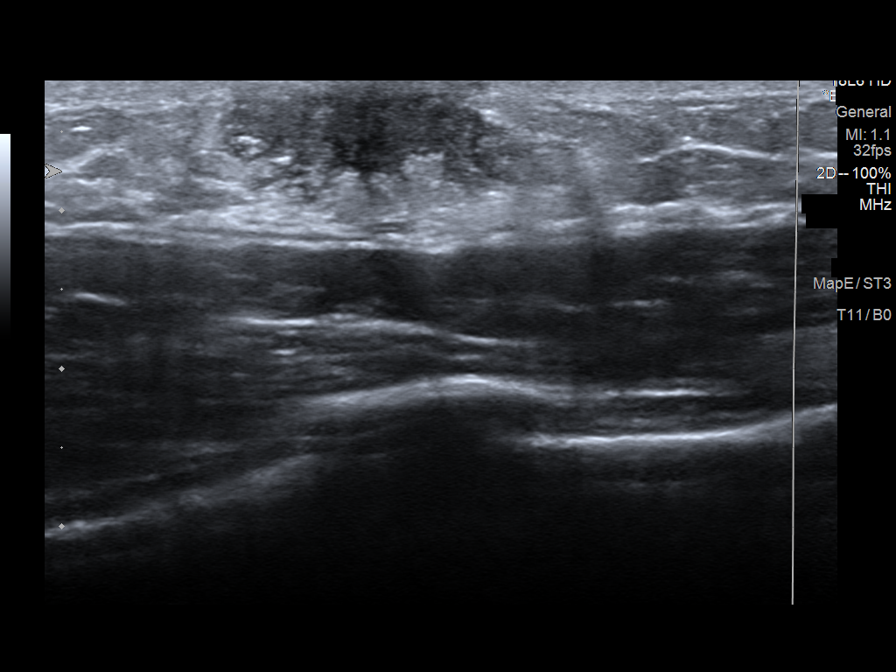
[im 3/9]
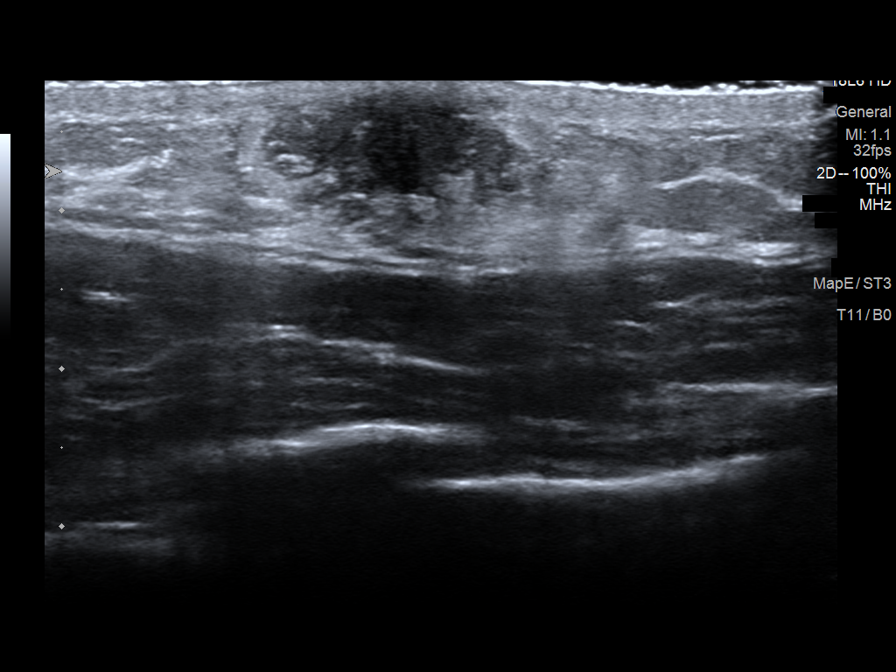
[im 4/9]
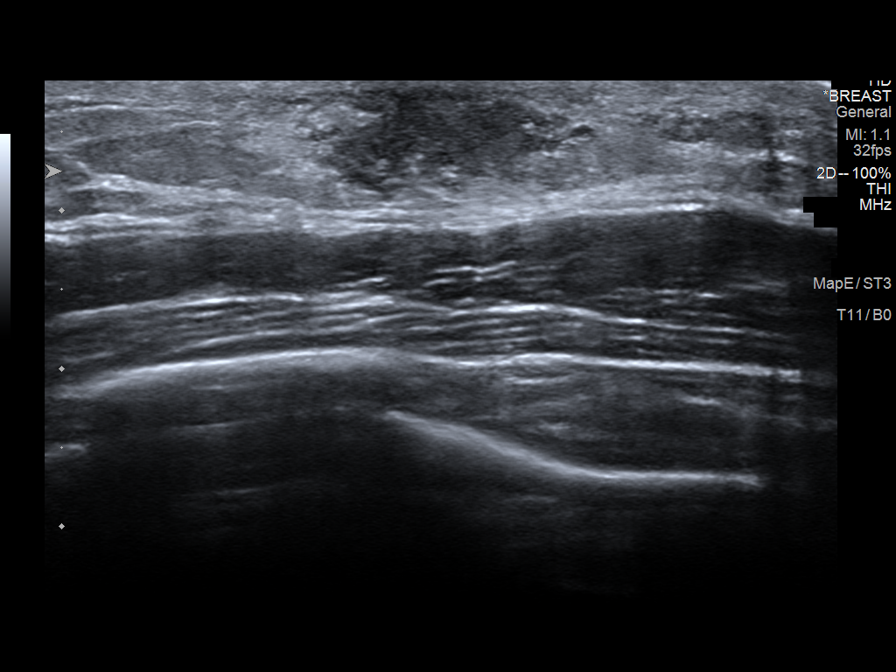
[im 5/9]
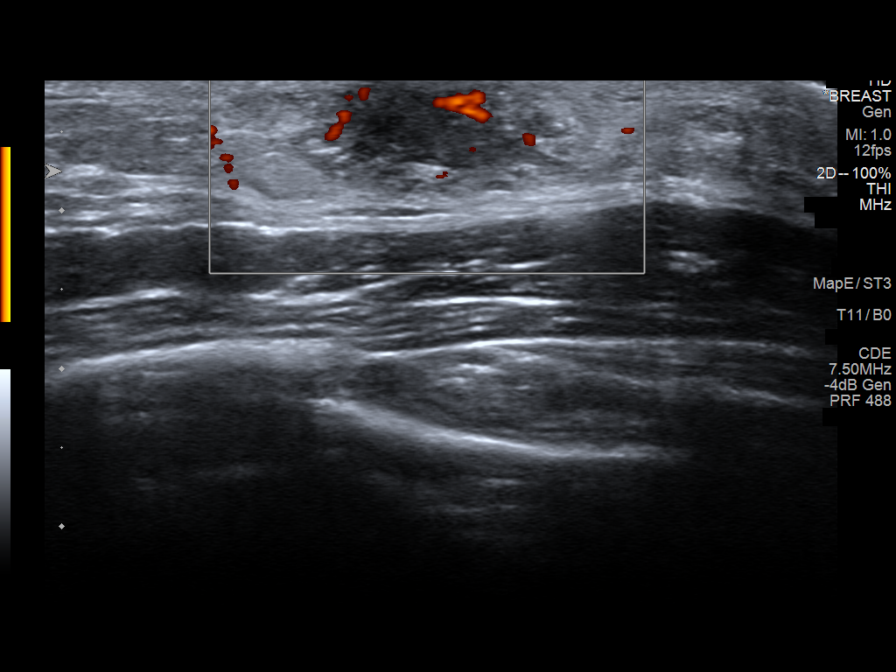
[im 6/9]
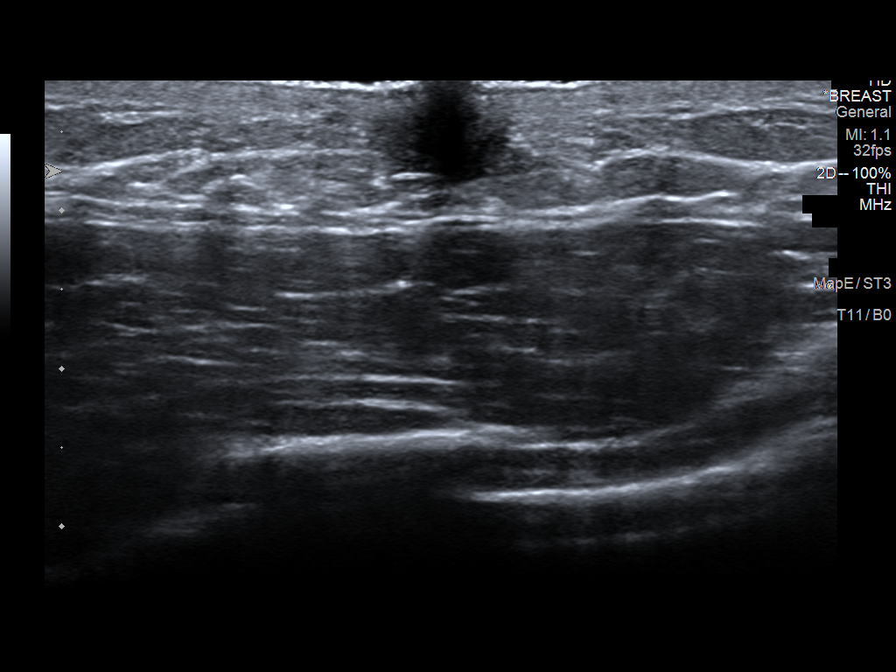
[im 7/9]
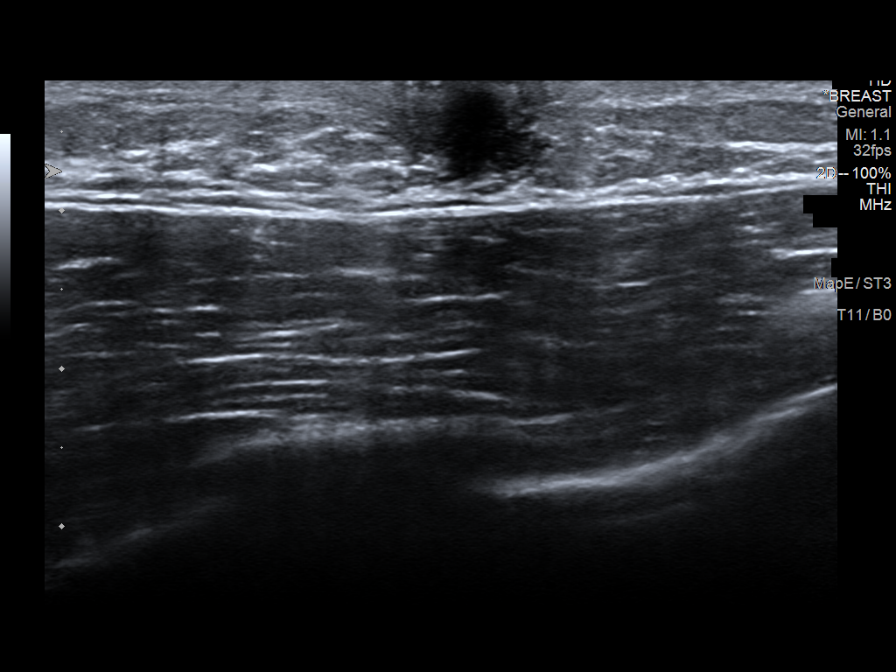
[im 8/9]
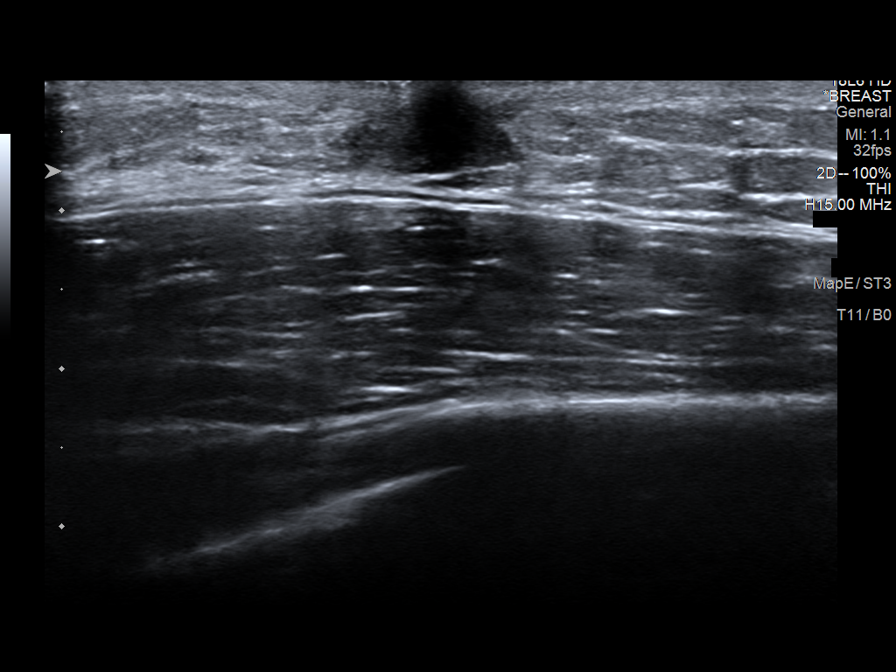
[im 9/9]
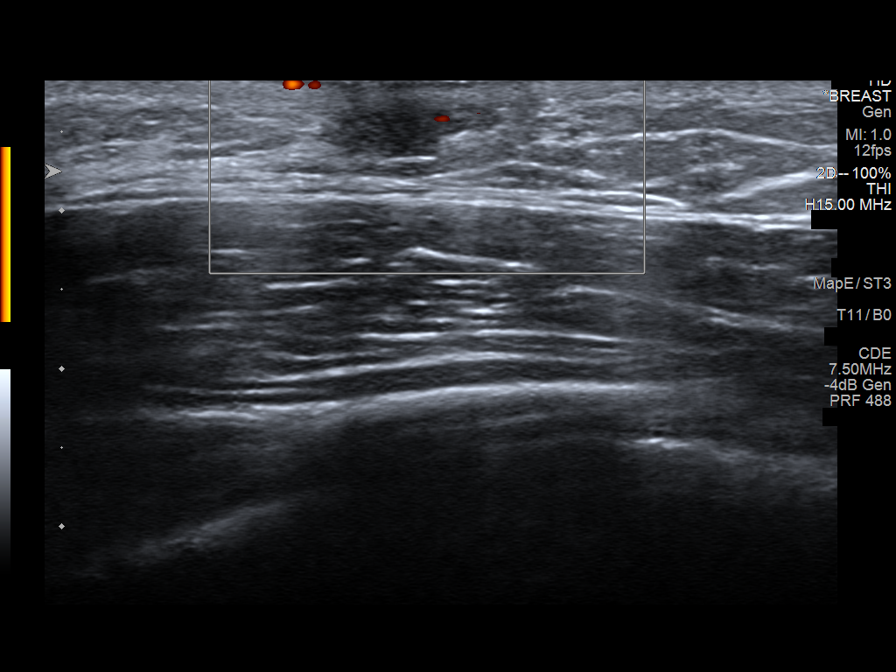

[9 of 9 positions shown; findings below may reference images not displayed]

FINDINGS: Targeted ultrasound is performed, evaluating the subareolar and
periareolar LEFT breast corresponding to the area of clinical
concern, showing mild gynecomastia, mixed nodular and dendritic type
IMPRESSION: Benign mild LEFT-sided gynecomastia, mixed nodular and dendritic
type.

RECOMMENDATION:
1. Clinical follow-up
2. I discussed with the patient the fact that gynecomastia can occur
in younger men with low testosterone levels, causing a change in the
serum testosterone:estrogen ratio. We also discussed other potential
etiologies of gynecomastia including numerous prescription
medications. Chronic liver disease and recreational drugs (marijuana
and anabolic steroids in particular) can also cause gynecomastia.
The patient was instructed to return for additional imaging if the
area that he feels becomes larger and/or firmer to palpation, or if
a new palpable abnormality is identified in either breast. We also
discussed the possibility of surgical excision if symptoms continue
and if an etiology of the gynecomastia cannot be determined and
therefore corrected.

I have discussed the findings and recommendations with the patient.
Results were also provided in writing at the conclusion of the
visit. If applicable, a reminder letter will be sent to the patient
regarding the next appointment.

BI-RADS CATEGORY  2: Benign.

## 2023-04-04 ENCOUNTER — Encounter (HOSPITAL_COMMUNITY): Payer: Self-pay

## 2023-04-04 ENCOUNTER — Emergency Department (HOSPITAL_COMMUNITY)
Admission: EM | Admit: 2023-04-04 | Discharge: 2023-04-04 | Disposition: A | Discharge status: Other Acute Inpatient Hospital | Payer: BC Managed Care – PPO | Attending: Emergency Medicine | Admitting: Emergency Medicine

## 2023-04-04 ENCOUNTER — Emergency Department (HOSPITAL_COMMUNITY): Payer: BC Managed Care – PPO

## 2023-04-04 ENCOUNTER — Other Ambulatory Visit: Payer: Self-pay

## 2023-04-04 DIAGNOSIS — Z23 Encounter for immunization: Secondary | ICD-10-CM | POA: Insufficient documentation

## 2023-04-04 DIAGNOSIS — W231XXA Caught, crushed, jammed, or pinched between stationary objects, initial encounter: Secondary | ICD-10-CM | POA: Insufficient documentation

## 2023-04-04 DIAGNOSIS — S68011A Complete traumatic metacarpophalangeal amputation of right thumb, initial encounter: Secondary | ICD-10-CM

## 2023-04-04 DIAGNOSIS — S6991XA Unspecified injury of right wrist, hand and finger(s), initial encounter: Secondary | ICD-10-CM | POA: Diagnosis present

## 2023-04-04 DIAGNOSIS — Y9389 Activity, other specified: Secondary | ICD-10-CM | POA: Diagnosis not present

## 2023-04-04 DIAGNOSIS — S68511A Complete traumatic transphalangeal amputation of right thumb, initial encounter: Secondary | ICD-10-CM | POA: Diagnosis not present

## 2023-04-04 MED ORDER — SODIUM CHLORIDE 0.9 % IV BOLUS
1000.0000 mL | Freq: Once | INTRAVENOUS | Status: AC
  Administered 2023-04-04: 1000 mL via INTRAVENOUS

## 2023-04-04 MED ORDER — HYDROMORPHONE HCL 1 MG/ML IJ SOLN: 1.0000 mg | INTRAMUSCULAR | Status: DC | PRN

## 2023-04-04 MED ORDER — FENTANYL CITRATE PF 50 MCG/ML IJ SOSY
50.0000 ug | PREFILLED_SYRINGE | Freq: Once | INTRAMUSCULAR | Status: AC
  Administered 2023-04-04: 50 ug via INTRAVENOUS
  Filled 2023-04-04: qty 1

## 2023-04-04 MED ORDER — HYDROMORPHONE HCL 1 MG/ML IJ SOLN
1.0000 mg | Freq: Once | INTRAMUSCULAR | Status: AC
  Administered 2023-04-04: 1 mg via INTRAVENOUS
  Filled 2023-04-04: qty 1

## 2023-04-04 MED ORDER — TETANUS-DIPHTH-ACELL PERTUSSIS 5-2.5-18.5 LF-MCG/0.5 IM SUSY
0.5000 mL | PREFILLED_SYRINGE | Freq: Once | INTRAMUSCULAR | Status: AC
  Administered 2023-04-04: 0.5 mL via INTRAMUSCULAR
  Filled 2023-04-04: qty 0.5

## 2023-04-04 MED ORDER — CEFAZOLIN SODIUM-DEXTROSE 2-4 GM/100ML-% IV SOLN
2.0000 g | Freq: Once | INTRAVENOUS | Status: AC
  Administered 2023-04-04: 2 g via INTRAVENOUS
  Filled 2023-04-04: qty 100

## 2023-04-04 NOTE — Progress Notes (Signed)
History and clinical photos reviewed with Dr Criss Alvine.   I have recommended urgent transfer to regional replantation center for consideration for thumb replantation, and provided guidance to Dr Criss Alvine regarding care for the amputated segment.  If regional replantation center determines that replantation is not possible, we can keep patient here and perform revision amputation.  Neil Crouch, MD

## 2023-04-04 NOTE — ED Notes (Signed)
Transfer of care report given to [facility] RN Tori who is on the way and will arrive in about 1 hour.
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
## 2023-04-04 NOTE — ED Notes (Signed)
This RN updated transfer of care report to transport RN. PT well appearing upon transport.

## 2023-04-04 NOTE — ED Notes (Signed)
X-ray at bedside
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
## 2023-04-04 NOTE — ED Notes (Signed)
Pt and family updated on plan of care  

## 2023-04-04 NOTE — ED Notes (Signed)
Called [facility] for transport  Informed it would be around an hour and a half before a truck would arrive
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
## 2023-04-04 NOTE — ED Notes (Addendum)
Transfer of care report given to Charge RN Weston Brass at [facility] ED. Transport being set up by [employer] operational officer. Pt and family updated.
